# Patient Record
Sex: Male | Born: 1980 | Hispanic: Yes | Marital: Married | Smoking: Current every day smoker
Health system: Southern US, Community
[De-identification: ages and names within clinical notes are randomized; demographics above are authoritative.]

## PROBLEM LIST (undated history)

## (undated) DIAGNOSIS — F419 Anxiety disorder, unspecified: Secondary | ICD-10-CM

## (undated) DIAGNOSIS — M549 Dorsalgia, unspecified: Secondary | ICD-10-CM

## (undated) HISTORY — PX: KNEE SURGERY: SHX244

## (undated) HISTORY — PX: FACIAL RECONSTRUCTION SURGERY: SHX631

---

## 1998-05-05 ENCOUNTER — Emergency Department (HOSPITAL_COMMUNITY): Admission: EM | Admit: 1998-05-05 | Discharge: 1998-05-05 | Payer: Self-pay | Admitting: Endocrinology

## 1998-05-15 ENCOUNTER — Emergency Department (HOSPITAL_COMMUNITY): Admission: EM | Admit: 1998-05-15 | Discharge: 1998-05-15 | Payer: Self-pay | Admitting: Emergency Medicine

## 1998-07-15 ENCOUNTER — Emergency Department (HOSPITAL_COMMUNITY): Admission: EM | Admit: 1998-07-15 | Discharge: 1998-07-15 | Payer: Self-pay | Admitting: Emergency Medicine

## 2000-07-20 ENCOUNTER — Emergency Department (HOSPITAL_COMMUNITY): Admission: EM | Admit: 2000-07-20 | Discharge: 2000-07-20 | Payer: Self-pay | Admitting: Emergency Medicine

## 2001-03-26 ENCOUNTER — Emergency Department (HOSPITAL_COMMUNITY): Admission: EM | Admit: 2001-03-26 | Discharge: 2001-03-26 | Payer: Self-pay | Admitting: Emergency Medicine

## 2008-03-02 ENCOUNTER — Emergency Department (HOSPITAL_COMMUNITY): Admission: EM | Admit: 2008-03-02 | Discharge: 2008-03-02 | Payer: Self-pay | Admitting: Emergency Medicine

## 2008-04-28 ENCOUNTER — Encounter: Admission: RE | Admit: 2008-04-28 | Discharge: 2008-04-28 | Payer: Self-pay | Admitting: Nephrology

## 2008-07-12 ENCOUNTER — Emergency Department (HOSPITAL_COMMUNITY): Admission: EM | Admit: 2008-07-12 | Discharge: 2008-07-12 | Payer: Self-pay | Admitting: Emergency Medicine

## 2008-07-27 ENCOUNTER — Encounter: Admission: RE | Admit: 2008-07-27 | Discharge: 2008-07-27 | Payer: Self-pay | Admitting: Orthopedic Surgery

## 2008-08-04 ENCOUNTER — Ambulatory Visit (HOSPITAL_BASED_OUTPATIENT_CLINIC_OR_DEPARTMENT_OTHER): Admission: RE | Admit: 2008-08-04 | Discharge: 2008-08-04 | Payer: Self-pay | Admitting: Orthopedic Surgery

## 2009-02-08 ENCOUNTER — Emergency Department (HOSPITAL_COMMUNITY): Admission: EM | Admit: 2009-02-08 | Discharge: 2009-02-08 | Payer: Self-pay | Admitting: Emergency Medicine

## 2009-02-13 ENCOUNTER — Emergency Department (HOSPITAL_COMMUNITY): Admission: EM | Admit: 2009-02-13 | Discharge: 2009-02-13 | Payer: Self-pay | Admitting: Emergency Medicine

## 2009-02-28 ENCOUNTER — Ambulatory Visit (HOSPITAL_COMMUNITY): Admission: RE | Admit: 2009-02-28 | Discharge: 2009-02-28 | Payer: Self-pay | Admitting: Chiropractic Medicine

## 2009-04-04 ENCOUNTER — Emergency Department (HOSPITAL_COMMUNITY): Admission: EM | Admit: 2009-04-04 | Discharge: 2009-04-04 | Payer: Self-pay | Admitting: Emergency Medicine

## 2009-08-15 ENCOUNTER — Inpatient Hospital Stay (HOSPITAL_COMMUNITY): Admission: AC | Admit: 2009-08-15 | Discharge: 2009-08-16 | Payer: Self-pay

## 2010-01-21 ENCOUNTER — Emergency Department (HOSPITAL_COMMUNITY): Admission: EM | Admit: 2010-01-21 | Discharge: 2010-01-21 | Payer: Self-pay | Admitting: Emergency Medicine

## 2010-10-28 ENCOUNTER — Encounter: Payer: Self-pay | Admitting: Orthopedic Surgery

## 2010-10-28 ENCOUNTER — Encounter: Payer: Self-pay | Admitting: Nephrology

## 2010-11-29 ENCOUNTER — Ambulatory Visit
Admission: RE | Admit: 2010-11-29 | Discharge: 2010-11-29 | Disposition: A | Discharge status: Home / Self Care | Payer: Self-pay | Source: Ambulatory Visit | Attending: Nephrology | Admitting: Nephrology

## 2010-11-29 ENCOUNTER — Other Ambulatory Visit: Payer: Self-pay | Admitting: Nephrology

## 2010-11-29 DIAGNOSIS — I10 Essential (primary) hypertension: Secondary | ICD-10-CM

## 2011-01-09 LAB — CBC
HCT: 45.7 % (ref 39.0–52.0)
Hemoglobin: 13.1 g/dL (ref 13.0–17.0)
MCHC: 35 g/dL (ref 30.0–36.0)
MCV: 86.4 fL (ref 78.0–100.0)
MCV: 86.7 fL (ref 78.0–100.0)
Platelets: 237 10*3/uL (ref 150–400)
RBC: 4.34 MIL/uL (ref 4.22–5.81)
RBC: 4.56 MIL/uL (ref 4.22–5.81)
RBC: 5.29 MIL/uL (ref 4.22–5.81)
WBC: 13.2 10*3/uL — ABNORMAL HIGH (ref 4.0–10.5)
WBC: 21.6 10*3/uL — ABNORMAL HIGH (ref 4.0–10.5)
WBC: 22.8 10*3/uL — ABNORMAL HIGH (ref 4.0–10.5)

## 2011-01-09 LAB — GLUCOSE, CAPILLARY: Glucose-Capillary: 123 mg/dL — ABNORMAL HIGH (ref 70–99)

## 2011-01-09 LAB — PROTIME-INR: INR: 1.05 (ref 0.00–1.49)

## 2011-01-09 LAB — COMPREHENSIVE METABOLIC PANEL
AST: 149 U/L — ABNORMAL HIGH (ref 0–37)
Albumin: 3.9 g/dL (ref 3.5–5.2)
Alkaline Phosphatase: 53 U/L (ref 39–117)
BUN: 16 mg/dL (ref 6–23)
CO2: 23 mEq/L (ref 19–32)
Chloride: 104 mEq/L (ref 96–112)
Creatinine, Ser: 1.15 mg/dL (ref 0.4–1.5)
GFR calc non Af Amer: >60 mL/min (ref >60)
Potassium: 4.1 mEq/L (ref 3.5–5.1)
Total Bilirubin: 1.1 mg/dL (ref 0.3–1.2)

## 2011-01-09 LAB — BASIC METABOLIC PANEL
BUN: 12 mg/dL (ref 6–23)
CO2: 24 mEq/L (ref 19–32)
Calcium: 7.9 mg/dL — ABNORMAL LOW (ref 8.4–10.5)
Chloride: 109 mEq/L (ref 96–112)
Creatinine, Ser: 1.03 mg/dL (ref 0.4–1.5)
Creatinine, Ser: 1.09 mg/dL (ref 0.4–1.5)
GFR calc Af Amer: >60 mL/min (ref >60)
GFR calc Af Amer: >60 mL/min (ref >60)
GFR calc non Af Amer: >60 mL/min (ref >60)
Sodium: 139 mEq/L (ref 135–145)

## 2011-01-09 LAB — TYPE AND SCREEN

## 2011-01-09 LAB — POCT I-STAT, CHEM 8
BUN: 21 mg/dL (ref 6–23)
Calcium, Ion: 1.1 mmol/L — ABNORMAL LOW (ref 1.12–1.32)
HCT: 48 % (ref 39.0–52.0)
Hemoglobin: 16.3 g/dL (ref 13.0–17.0)
Sodium: 140 meq/L (ref 135–145)
TCO2: 24 mmol/L (ref 0–100)

## 2011-01-09 LAB — APTT: aPTT: 23 seconds — ABNORMAL LOW (ref 24–37)

## 2011-01-09 LAB — ABO/RH: ABO/RH(D): A POS

## 2011-01-14 LAB — POCT I-STAT, CHEM 8
BUN: 23 mg/dL (ref 6–23)
Creatinine, Ser: 0.9 mg/dL (ref 0.4–1.5)
Glucose, Bld: 106 mg/dL — ABNORMAL HIGH (ref 70–99)
Hemoglobin: 14.3 g/dL (ref 13.0–17.0)
Potassium: 3.5 mEq/L (ref 3.5–5.1)
Sodium: 140 mEq/L (ref 135–145)
TCO2: 23 mmol/L (ref 0–100)

## 2011-02-19 NOTE — Op Note (Signed)
Paul Woodward, Paul Woodward             ACCOUNT NO.:  [id number]   MEDICAL RECORD NO.:  [id number]          PATIENT TYPE:  AMB   LOCATION:  DSC                          FACILITY:  [facility]   PHYSICIAN:  Rodney A. Mortenson, M.D.DATE OF BIRTH:  15-Mar-1981   DATE OF PROCEDURE:  08/04/2008  DATE OF DISCHARGE:                               OPERATIVE REPORT   JUSTIFICATION:  A 30 year old male has pain about the tibial tubercle on  the right.  Activity increases his pain and discomfort.  He is noted to  have bony ossicle just posterior to the patella tendon where the patella  tendon attached to the tibial tubercle and this was locally tender.  He  is now admitted to have removal of this bony mass.   Complications are discussed preoperatively.  Questions are answered and  encouraged.  No guarantee could be given and he clearly understands, but  wished to proceed.   JUSTIFICATION ON PATIENT SURGERY:  Minimal morbidity.   PREOPERATIVE DIAGNOSIS:  Bony ossicle, patellar tendon, right knee.   POSTOPERATIVE DIAGNOSIS:  Bony ossicle, patellar tendon, right knee.   OPERATION:  Removal of bony ossicle, superior to right tibial tubercle  and attached to posterior surface of patellar tendon, right knee.   SURGEON:  Lenard Galloway. Chaney Malling, MD   ANESTHESIA:  General.   PROCEDURE:  The patient was placed on the operating room table in the  supine position.  A pneumatic tourniquet was applied to the right upper  thigh.  The right lower extremity was prepped with DuraPrep and draped  out in the usual manner.  Leg was wrapped with an Esmarch and tourniquet  was elevated.  An incision was made parallel to the medial border of the  patellar tendon.  Skin edges were retracted.  Bleeders were coagulated.  The blunt dissection was carried down along the margin of the patellar  tendon.  The space behind the patellar tendon was entered and there was  a very large bony ossicle which was free, but attached to the  posterior  surface of patellar tendon at this level.  This is bang up against the  anterior aspect of the tibia.  Self-retaining retractor was put in  place.  Under direct vision using blunt and sharp dissection, this large  bony ossicle was carefully teased out of the posterior surface of the  patellar tendon.  It did not invade significant substance of the tendon  itself.  The whole ostia was removed as a single large entity and  delivered to the back table.  A 2-0 Vicryl was used to close the  paratenon and soft tissue.  The skin was closed with stainless steel  staples.  Marcaine was placed in wound.  Sterile dressing was applied.  The patient returned to the recovery room in the excellent condition.  Technically, this went extremely well.   DRAINS:  None.   COMPLICATIONS:  None.   DISPOSITION:  1. Norco for pain.  2. Usual postop instructions.  3. To my office on Wednesday.      Rodney A. Chaney Malling, M.D.  Electronically Signed  RAM/MEDQ  D:  08/04/2008  T:  08/04/2008  Job:  [id number]

## 2011-04-08 ENCOUNTER — Emergency Department (HOSPITAL_COMMUNITY)
Admission: EM | Admit: 2011-04-08 | Discharge: 2011-04-08 | Disposition: A | Discharge status: Home / Self Care | Payer: PRIVATE HEALTH INSURANCE | Attending: Emergency Medicine | Admitting: Emergency Medicine

## 2011-04-08 ENCOUNTER — Emergency Department (HOSPITAL_COMMUNITY): Payer: PRIVATE HEALTH INSURANCE

## 2011-04-08 DIAGNOSIS — IMO0002 Reserved for concepts with insufficient information to code with codable children: Secondary | ICD-10-CM | POA: Insufficient documentation

## 2011-04-08 DIAGNOSIS — R071 Chest pain on breathing: Secondary | ICD-10-CM | POA: Insufficient documentation

## 2011-04-08 DIAGNOSIS — I10 Essential (primary) hypertension: Secondary | ICD-10-CM | POA: Insufficient documentation

## 2011-04-08 DIAGNOSIS — F411 Generalized anxiety disorder: Secondary | ICD-10-CM | POA: Insufficient documentation

## 2011-04-08 DIAGNOSIS — R079 Chest pain, unspecified: Secondary | ICD-10-CM | POA: Insufficient documentation

## 2011-04-18 ENCOUNTER — Inpatient Hospital Stay (INDEPENDENT_AMBULATORY_CARE_PROVIDER_SITE_OTHER)
Admission: RE | Admit: 2011-04-18 | Discharge: 2011-04-18 | Disposition: A | Discharge status: Home / Self Care | Payer: Self-pay | Source: Ambulatory Visit | Attending: Family Medicine | Admitting: Family Medicine

## 2011-04-18 DIAGNOSIS — I10 Essential (primary) hypertension: Secondary | ICD-10-CM

## 2011-04-18 DIAGNOSIS — L089 Local infection of the skin and subcutaneous tissue, unspecified: Secondary | ICD-10-CM

## 2011-05-06 ENCOUNTER — Emergency Department (HOSPITAL_COMMUNITY)
Admission: EM | Admit: 2011-05-06 | Discharge: 2011-05-09 | Disposition: A | Discharge status: Other Acute Inpatient Hospital | Payer: Self-pay | Attending: Emergency Medicine | Admitting: Emergency Medicine

## 2011-05-06 DIAGNOSIS — F319 Bipolar disorder, unspecified: Secondary | ICD-10-CM | POA: Insufficient documentation

## 2011-05-06 DIAGNOSIS — F29 Unspecified psychosis not due to a substance or known physiological condition: Secondary | ICD-10-CM | POA: Insufficient documentation

## 2011-05-06 DIAGNOSIS — I10 Essential (primary) hypertension: Secondary | ICD-10-CM | POA: Insufficient documentation

## 2011-05-06 LAB — RAPID URINE DRUG SCREEN, HOSP PERFORMED
Benzodiazepines: POSITIVE — AB
Cocaine: NOT DETECTED
Opiates: NOT DETECTED

## 2011-05-06 LAB — CBC
HCT: 42 % (ref 39.0–52.0)
Hemoglobin: 15.1 g/dL (ref 13.0–17.0)
MCH: 29.8 pg (ref 26.0–34.0)
MCHC: 36 g/dL (ref 30.0–36.0)
MCV: 83 fL (ref 78.0–100.0)
RDW: 13.2 % (ref 11.5–15.5)

## 2011-05-06 LAB — COMPREHENSIVE METABOLIC PANEL
Albumin: 4.2 g/dL (ref 3.5–5.2)
Alkaline Phosphatase: 77 U/L (ref 39–117)
BUN: 12 mg/dL (ref 6–23)
Calcium: 9.6 mg/dL (ref 8.4–10.5)
Creatinine, Ser: 0.86 mg/dL (ref 0.50–1.35)
GFR calc Af Amer: >60 mL/min (ref >60)
Glucose, Bld: 97 mg/dL (ref 70–99)
Total Protein: 6.9 g/dL (ref 6.0–8.3)

## 2011-05-06 LAB — DIFFERENTIAL
Eosinophils Relative: 1 % (ref 0–5)
Lymphocytes Relative: 22 % (ref 12–46)
Monocytes Absolute: 0.7 10*3/uL (ref 0.1–1.0)
Monocytes Relative: 7 % (ref 3–12)
Neutro Abs: 7.6 10*3/uL (ref 1.7–7.7)

## 2011-05-06 LAB — ETHANOL: Alcohol, Ethyl (B): <11 mg/dL (ref 0–11)

## 2011-05-09 ENCOUNTER — Inpatient Hospital Stay (HOSPITAL_COMMUNITY)
Admission: AD | Admit: 2011-05-09 | Discharge: 2011-05-12 | DRG: 885 | Disposition: A | Discharge status: Home / Self Care | Payer: PRIVATE HEALTH INSURANCE | Attending: Psychiatry | Admitting: Psychiatry

## 2011-05-09 DIAGNOSIS — Z8614 Personal history of Methicillin resistant Staphylococcus aureus infection: Secondary | ICD-10-CM

## 2011-05-09 DIAGNOSIS — Z91199 Patient's noncompliance with other medical treatment and regimen due to unspecified reason: Secondary | ICD-10-CM

## 2011-05-09 DIAGNOSIS — Z9119 Patient's noncompliance with other medical treatment and regimen: Secondary | ICD-10-CM

## 2011-05-09 DIAGNOSIS — F319 Bipolar disorder, unspecified: Secondary | ICD-10-CM

## 2011-05-09 DIAGNOSIS — I10 Essential (primary) hypertension: Secondary | ICD-10-CM

## 2011-05-09 DIAGNOSIS — F39 Unspecified mood [affective] disorder: Principal | ICD-10-CM

## 2011-05-09 DIAGNOSIS — F411 Generalized anxiety disorder: Secondary | ICD-10-CM

## 2011-05-09 DIAGNOSIS — M549 Dorsalgia, unspecified: Secondary | ICD-10-CM

## 2011-05-09 DIAGNOSIS — G8929 Other chronic pain: Secondary | ICD-10-CM

## 2011-05-09 DIAGNOSIS — F602 Antisocial personality disorder: Secondary | ICD-10-CM

## 2011-05-10 DIAGNOSIS — F39 Unspecified mood [affective] disorder: Secondary | ICD-10-CM
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
## 2011-07-02 ENCOUNTER — Emergency Department (HOSPITAL_COMMUNITY)
Admission: EM | Admit: 2011-07-02 | Discharge: 2011-07-02 | Disposition: A | Discharge status: Home / Self Care | Payer: Self-pay | Attending: Emergency Medicine | Admitting: Emergency Medicine

## 2011-07-02 DIAGNOSIS — G8929 Other chronic pain: Secondary | ICD-10-CM | POA: Insufficient documentation

## 2011-07-02 DIAGNOSIS — M549 Dorsalgia, unspecified: Secondary | ICD-10-CM | POA: Insufficient documentation

## 2011-07-02 DIAGNOSIS — Z202 Contact with and (suspected) exposure to infections with a predominantly sexual mode of transmission: Secondary | ICD-10-CM | POA: Insufficient documentation

## 2011-07-02 DIAGNOSIS — I10 Essential (primary) hypertension: Secondary | ICD-10-CM | POA: Insufficient documentation

## 2011-07-09 LAB — POCT HEMOGLOBIN-HEMACUE: Hemoglobin: 16.3
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
## 2011-11-09 ENCOUNTER — Encounter (HOSPITAL_COMMUNITY): Payer: Self-pay | Admitting: Emergency Medicine

## 2011-11-09 ENCOUNTER — Emergency Department (HOSPITAL_COMMUNITY)
Admission: EM | Admit: 2011-11-09 | Discharge: 2011-11-09 | Disposition: A | Discharge status: Home / Self Care | Payer: Self-pay | Attending: Emergency Medicine | Admitting: Emergency Medicine

## 2011-11-09 DIAGNOSIS — R197 Diarrhea, unspecified: Secondary | ICD-10-CM | POA: Insufficient documentation

## 2011-11-09 DIAGNOSIS — I10 Essential (primary) hypertension: Secondary | ICD-10-CM | POA: Insufficient documentation

## 2011-11-09 DIAGNOSIS — R1013 Epigastric pain: Secondary | ICD-10-CM | POA: Insufficient documentation

## 2011-11-09 DIAGNOSIS — R10816 Epigastric abdominal tenderness: Secondary | ICD-10-CM | POA: Insufficient documentation

## 2011-11-09 DIAGNOSIS — R112 Nausea with vomiting, unspecified: Secondary | ICD-10-CM | POA: Insufficient documentation

## 2011-11-09 HISTORY — DX: Dorsalgia, unspecified: M54.9

## 2011-11-09 LAB — COMPREHENSIVE METABOLIC PANEL
Albumin: 4.4 g/dL (ref 3.5–5.2)
BUN: 15 mg/dL (ref 6–23)
CO2: 29 mEq/L (ref 19–32)
Calcium: 10.1 mg/dL (ref 8.4–10.5)
Chloride: 102 mEq/L (ref 96–112)
Creatinine, Ser: 0.99 mg/dL (ref 0.50–1.35)
GFR calc non Af Amer: >90 mL/min (ref >90)
Total Bilirubin: 0.7 mg/dL (ref 0.3–1.2)

## 2011-11-09 LAB — DIFFERENTIAL
Basophils Relative: 0 % (ref 0–1)
Eosinophils Relative: 1 % (ref 0–5)
Monocytes Absolute: 0.8 10*3/uL (ref 0.1–1.0)
Monocytes Relative: 8 % (ref 3–12)
Neutro Abs: 7.4 10*3/uL (ref 1.7–7.7)

## 2011-11-09 LAB — CBC
HCT: 48.2 % (ref 39.0–52.0)
Hemoglobin: 17.3 g/dL — ABNORMAL HIGH (ref 13.0–17.0)
MCHC: 35.9 g/dL (ref 30.0–36.0)
MCV: 84.4 fL (ref 78.0–100.0)

## 2011-11-09 LAB — LIPASE, BLOOD: Lipase: 59 U/L (ref 11–59)

## 2011-11-09 MED ORDER — OMEPRAZOLE 20 MG PO CPDR: 20.0000 mg | DELAYED_RELEASE_CAPSULE | Freq: Every day | ORAL | Status: DC

## 2011-11-09 MED ORDER — PANTOPRAZOLE SODIUM 40 MG PO TBEC
40.0000 mg | DELAYED_RELEASE_TABLET | Freq: Once | ORAL | Status: AC
Start: 2011-11-09 — End: 2011-11-09
  Administered 2011-11-09: 40 mg via ORAL
  Filled 2011-11-09: qty 1

## 2011-11-09 MED ORDER — SODIUM CHLORIDE 0.9 % IV BOLUS (SEPSIS)
1000.0000 mL | Freq: Once | INTRAVENOUS | Status: AC
  Administered 2011-11-09: 1000 mL via INTRAVENOUS

## 2011-11-09 MED ORDER — ONDANSETRON 8 MG PO TBDP: 8.0000 mg | ORAL_TABLET | Freq: Three times a day (TID) | ORAL | Status: AC | PRN

## 2011-11-09 MED ORDER — GI COCKTAIL ~~LOC~~
30.0000 mL | Freq: Once | ORAL | Status: AC
  Administered 2011-11-09: 30 mL via ORAL
  Filled 2011-11-09: qty 30

## 2011-11-09 MED ORDER — ONDANSETRON HCL 4 MG/2ML IJ SOLN
4.0000 mg | Freq: Once | INTRAMUSCULAR | Status: AC
  Administered 2011-11-09: 4 mg via INTRAVENOUS
  Filled 2011-11-09: qty 2

## 2011-11-09 NOTE — ED Provider Notes (Signed)
History     CSN: [id number]  Arrival date & time 11/09/11  1842   First MD Initiated Contact with Patient 11/09/11 1932      Chief Complaint  Patient presents with  . Abdominal Pain    (Consider location/radiation/quality/duration/timing/severity/associated sxs/prior treatment) Patient is a 31 y.o. male presenting with abdominal pain. The history is provided by the patient.  Abdominal Pain The primary symptoms of the illness include abdominal pain, nausea, vomiting and diarrhea. The primary symptoms of the illness do not include fever, shortness of breath or dysuria. The current episode started more than 2 days ago. The onset of the illness was sudden. The problem has been gradually worsening.  Symptoms associated with the illness do not include chills or hematuria.  PT reports epigastric abdominal pain for about 3 days. Onset sudden, pain comes and goes. Independent of anything. Denies fever, chills. Admits to nausea and vomiting this morning. Denies any urinary symptoms. Took tums with no relief. No prior similar pain.   Past Medical History  Diagnosis Date  . Hypertension   . Back pain     Past Surgical History  Procedure Date  . Facial reconstruction surgery     No family history on file.  History  Substance Use Topics  . Smoking status: Current Everyday Smoker -- 1.0 packs/day  . Smokeless tobacco: Not on file  . Alcohol Use: No      Review of Systems  Constitutional: Negative for fever and chills.  HENT: Negative.   Eyes: Negative.   Respiratory: Negative.  Negative for shortness of breath.   Cardiovascular: Negative.   Gastrointestinal: Positive for nausea, vomiting, abdominal pain and diarrhea.  Genitourinary: Negative for dysuria, hematuria and flank pain.  Musculoskeletal: Negative.   Skin: Negative.   Neurological: Negative.   Psychiatric/Behavioral: Negative.     Allergies  Review of patient's allergies indicates no known allergies.  Home  Medications   Current Outpatient Rx  Name Route Sig Dispense Refill  . ALPRAZOLAM 1 MG PO TABS Oral Take 1 mg by mouth at bedtime as needed. As needed for anxiety    . HYDROCODONE-ACETAMINOPHEN 10-325 MG PO TABS Oral Take 1 tablet by mouth every 6 (six) hours as needed. For pain    . NAPROXEN SODIUM 220 MG PO TABS Oral Take 220 mg by mouth 2 (two) times daily with a meal.      BP 142/98  Pulse 90  Temp(Src) 99 F (37.2 C) (Oral)  Resp 20  SpO2 98%  Physical Exam  Nursing note and vitals reviewed. Constitutional: He is oriented to person, place, and time. He appears well-developed and well-nourished.  HENT:  Head: Atraumatic.  Eyes: Conjunctivae are normal.  Neck: Neck supple.  Cardiovascular: Normal rate, regular rhythm and normal heart sounds.   Pulmonary/Chest: Effort normal and breath sounds normal. No respiratory distress.  Abdominal: Soft. Bowel sounds are normal.       Epigastric tenderness. No guarding. No rebound tenderness.  Neurological: He is alert and oriented to person, place, and time.  Skin: Skin is warm and dry.  Psychiatric: He has a normal mood and affect.    ED Course  Procedures (including critical care time) 7:50 PM Pt seen and examined. Does not appear to be in distress. Abdomen soft. No guarding. Epigastric tenderness. Labs and medications ordered. Will monitor.   Results for orders placed during the hospital encounter of 11/09/11  CBC      Component Value Range   WBC 10.9 (*) 4.0 -  10.5 (K/uL)   RBC 5.71  4.22 - 5.81 (MIL/uL)   Hemoglobin 17.3 (*) 13.0 - 17.0 (g/dL)   HCT 16.1  09.6 - 04.5 (%)   MCV 84.4  78.0 - 100.0 (fL)   MCH 30.3  26.0 - 34.0 (pg)   MCHC 35.9  30.0 - 36.0 (g/dL)   RDW 40.9  81.1 - 91.4 (%)   Platelets 249  150 - 400 (K/uL)  DIFFERENTIAL      Component Value Range   Neutrophils Relative 68  43 - 77 (%)   Neutro Abs 7.4  1.7 - 7.7 (K/uL)   Lymphocytes Relative 24  12 - 46 (%)   Lymphs Abs 2.6  0.7 - 4.0 (K/uL)    Monocytes Relative 8  3 - 12 (%)   Monocytes Absolute 0.8  0.1 - 1.0 (K/uL)   Eosinophils Relative 1  0 - 5 (%)   Eosinophils Absolute 0.1  0.0 - 0.7 (K/uL)   Basophils Relative 0  0 - 1 (%)   Basophils Absolute 0.0  0.0 - 0.1 (K/uL)  COMPREHENSIVE METABOLIC PANEL      Component Value Range   Sodium 141  135 - 145 (mEq/L)   Potassium 3.4 (*) 3.5 - 5.1 (mEq/L)   Chloride 102  96 - 112 (mEq/L)   CO2 29  19 - 32 (mEq/L)   Glucose, Bld 90  70 - 99 (mg/dL)   BUN 15  6 - 23 (mg/dL)   Creatinine, Ser 7.82  0.50 - 1.35 (mg/dL)   Calcium 95.6  8.4 - 10.5 (mg/dL)   Total Protein 7.6  6.0 - 8.3 (g/dL)   Albumin 4.4  3.5 - 5.2 (g/dL)   AST 29  0 - 37 (U/L)   ALT 88 (*) 0 - 53 (U/L)   Alkaline Phosphatase 84  39 - 117 (U/L)   Total Bilirubin 0.7  0.3 - 1.2 (mg/dL)   GFR calc non Af Amer >90  >90 (mL/min)   GFR calc Af Amer >90  >90 (mL/min)  LIPASE, BLOOD      Component Value Range   Lipase 59  11 - 59 (U/L)   No results found.  Pt reassessed. Tolerating PO fluids. Abdomen soft. Discussed with Dr. Juleen China that examined pt as well. Agrees with plan to DC home, start on PPI. Suspect gastritis vs reflux vs peptic ulcer disease.    No diagnosis found.    MDM          Lottie Mussel, PA 11/10/11 [phone]

## 2011-11-09 NOTE — ED Notes (Signed)
Pt left before discharge papers could be given. Security was notified that pt may return

## 2011-11-09 NOTE — ED Notes (Signed)
Pt presenting to ed with c/o abdominal pain with nausea, vomiting and diarrhea. Pt states he thinks he's been running fevers.

## 2011-11-10 NOTE — ED Provider Notes (Signed)
Medical screening examination/treatment/procedure(s) were conducted as a shared visit with non-physician practitioner(s) and myself.  I personally evaluated the patient during the encounter.  30yM with epigastric pain. Abdominal exam benign on my examination.consider gastritis/pud/reflux, pancreatitis. W/u relatively unremarkable. Doubt renal/biliary colic. Discussed case with PA, Krichenko. PPI/H2 blocker at her discretion,.  Raeford Razor, MD 11/10/11 [phone]

## 2011-11-11 ENCOUNTER — Emergency Department (HOSPITAL_BASED_OUTPATIENT_CLINIC_OR_DEPARTMENT_OTHER)
Admission: EM | Admit: 2011-11-11 | Discharge: 2011-11-11 | Disposition: A | Discharge status: Home / Self Care | Payer: Self-pay | Attending: Emergency Medicine | Admitting: Emergency Medicine

## 2011-11-11 ENCOUNTER — Encounter (HOSPITAL_BASED_OUTPATIENT_CLINIC_OR_DEPARTMENT_OTHER): Payer: Self-pay | Admitting: *Deleted

## 2011-11-11 DIAGNOSIS — I10 Essential (primary) hypertension: Secondary | ICD-10-CM | POA: Insufficient documentation

## 2011-11-11 DIAGNOSIS — M549 Dorsalgia, unspecified: Secondary | ICD-10-CM | POA: Insufficient documentation

## 2011-11-11 DIAGNOSIS — F17200 Nicotine dependence, unspecified, uncomplicated: Secondary | ICD-10-CM | POA: Insufficient documentation

## 2011-11-11 DIAGNOSIS — K047 Periapical abscess without sinus: Secondary | ICD-10-CM | POA: Insufficient documentation

## 2011-11-11 DIAGNOSIS — K089 Disorder of teeth and supporting structures, unspecified: Secondary | ICD-10-CM | POA: Insufficient documentation

## 2011-11-11 MED ORDER — PENICILLIN V POTASSIUM 500 MG PO TABS: 500.0000 mg | ORAL_TABLET | Freq: Four times a day (QID) | ORAL | Status: AC

## 2011-11-11 NOTE — ED Provider Notes (Signed)
History     CSN: [id number]  Arrival date & time 11/11/11  1335   First MD Initiated Contact with Patient 11/11/11 1511      Chief Complaint  Patient presents with  . Dental Pain    (Consider location/radiation/quality/duration/timing/severity/associated sxs/prior treatment) Patient is a 31 y.o. male presenting with tooth pain. The history is provided by the patient.  Dental PainThe primary symptoms include mouth pain. Primary symptoms do not include fever or shortness of breath. The symptoms began 3 to 5 days ago. The symptoms are worsening. The symptoms are new. The symptoms occur constantly.  Mouth pain occurs constantly. Mouth pain is worsening. Affected locations include: gum(s).  Additional symptoms include: gum swelling, gum tenderness and jaw pain. Additional symptoms do not include: purulent gums, trismus, facial swelling, trouble swallowing, pain with swallowing, drooling, ear pain, hearing loss and nosebleeds. Medical issues include: smoking.    Past Medical History  Diagnosis Date  . Hypertension   . Back pain     Past Surgical History  Procedure Date  . Facial reconstruction surgery     No family history on file.  History  Substance Use Topics  . Smoking status: Current Everyday Smoker -- 1.0 packs/day  . Smokeless tobacco: Not on file  . Alcohol Use: No      Review of Systems  Constitutional: Negative for fever and chills.  HENT: Positive for dental problem. Negative for hearing loss, ear pain, nosebleeds, facial swelling, drooling, trouble swallowing, neck pain, neck stiffness and voice change.   Respiratory: Negative for choking and shortness of breath.   Skin: Negative for color change.    Allergies  Review of patient's allergies indicates no known allergies.  Home Medications   Current Outpatient Rx  Name Route Sig Dispense Refill  . ALPRAZOLAM 1 MG PO TABS Oral Take 1 mg by mouth at bedtime as needed. As needed for anxiety    .  HYDROCODONE-ACETAMINOPHEN 10-325 MG PO TABS Oral Take 1 tablet by mouth every 6 (six) hours as needed. For pain    . NAPROXEN SODIUM 220 MG PO TABS Oral Take 220 mg by mouth 2 (two) times daily with a meal.    . OMEPRAZOLE 20 MG PO CPDR Oral Take 1 capsule (20 mg total) by mouth daily. 30 capsule 0  . ONDANSETRON 8 MG PO TBDP Oral Take 1 tablet (8 mg total) by mouth every 8 (eight) hours as needed for nausea. 12 tablet 0    BP 130/90  Pulse 76  Temp(Src) 98.9 F (37.2 C) (Oral)  Resp 20  SpO2 99%  Physical Exam  Nursing note and vitals reviewed. Constitutional: He is oriented to person, place, and time. He appears well-developed and well-nourished. No distress.  HENT:  Head: Normocephalic and atraumatic. No trismus in the jaw.  Right Ear: External ear normal.  Left Ear: External ear normal.  Nose: Nose normal.  Mouth/Throat: No oropharyngeal exudate.    Neck: Normal range of motion. Neck supple.       No ludwig angina  Cardiovascular: Normal rate and regular rhythm.   Pulmonary/Chest: Effort normal. No respiratory distress.  Abdominal: Soft. He exhibits no distension. There is no tenderness.  Musculoskeletal: He exhibits no edema and no tenderness.  Lymphadenopathy:    He has no cervical adenopathy.  Neurological: He is alert and oriented to person, place, and time. No cranial nerve deficit.  Skin: Skin is warm and dry. No rash noted. No erythema.  Psychiatric: He has a normal mood  and affect.    ED Course  Procedures (including critical care time)  Labs Reviewed - No data to display No results found.   Dx 1: Dental Abscess   MDM  Likely dental abscess with no pocket visible to drain in ED. WIll rx abx and strongly advised dental/OMFS f/u. Pt receives narcotics chronically from PCP and no further pain medication is necessary today.        Shaaron Adler, [place] 11/11/11 1556
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
## 2011-11-11 NOTE — ED Provider Notes (Signed)
Medical screening examination/treatment/procedure(s) were performed by non-physician practitioner and as supervising physician I was immediately available for consultation/collaboration. Aijalon Kirtz Y.   Aki Burdin Y. Barry Faircloth, MD 11/11/11 2359 

## 2012-03-01 ENCOUNTER — Emergency Department (HOSPITAL_COMMUNITY)
Admission: EM | Admit: 2012-03-01 | Discharge: 2012-03-01 | Disposition: A | Discharge status: Home / Self Care | Payer: No Typology Code available for payment source | Attending: Emergency Medicine | Admitting: Emergency Medicine

## 2012-03-01 ENCOUNTER — Emergency Department (HOSPITAL_COMMUNITY): Payer: No Typology Code available for payment source

## 2012-03-01 ENCOUNTER — Encounter (HOSPITAL_COMMUNITY): Payer: Self-pay | Admitting: Emergency Medicine

## 2012-03-01 DIAGNOSIS — S5001XA Contusion of right elbow, initial encounter: Secondary | ICD-10-CM

## 2012-03-01 DIAGNOSIS — S20229A Contusion of unspecified back wall of thorax, initial encounter: Secondary | ICD-10-CM | POA: Insufficient documentation

## 2012-03-01 DIAGNOSIS — S5000XA Contusion of unspecified elbow, initial encounter: Secondary | ICD-10-CM | POA: Insufficient documentation

## 2012-03-01 DIAGNOSIS — F17200 Nicotine dependence, unspecified, uncomplicated: Secondary | ICD-10-CM | POA: Insufficient documentation

## 2012-03-01 DIAGNOSIS — Y9289 Other specified places as the place of occurrence of the external cause: Secondary | ICD-10-CM | POA: Insufficient documentation

## 2012-03-01 DIAGNOSIS — M545 Low back pain, unspecified: Secondary | ICD-10-CM | POA: Insufficient documentation

## 2012-03-01 DIAGNOSIS — M25529 Pain in unspecified elbow: Secondary | ICD-10-CM | POA: Insufficient documentation

## 2012-03-01 MED ORDER — HYDROMORPHONE HCL PF 1 MG/ML IJ SOLN
1.0000 mg | Freq: Once | INTRAMUSCULAR | Status: AC
  Administered 2012-03-01: 1 mg via INTRAMUSCULAR
  Filled 2012-03-01: qty 1

## 2012-03-01 MED ORDER — NAPROXEN 500 MG PO TABS: 500.0000 mg | ORAL_TABLET | Freq: Two times a day (BID) | ORAL | Status: DC

## 2012-03-01 MED ORDER — HYDROCODONE-ACETAMINOPHEN 5-500 MG PO TABS: 1.0000 | ORAL_TABLET | Freq: Four times a day (QID) | ORAL | Status: AC | PRN

## 2012-03-01 MED ORDER — TRAMADOL HCL 50 MG PO TABS: 50.0000 mg | ORAL_TABLET | Freq: Four times a day (QID) | ORAL | Status: DC | PRN
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
## 2012-03-01 NOTE — ED Notes (Signed)
Pt states understanding of discharge instructions 

## 2012-03-01 NOTE — Discharge Instructions (Signed)
xrays are normal - see your doctor as needed - naprosyn twice daily, ultram for severe pain
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
## 2012-03-09 ENCOUNTER — Other Ambulatory Visit: Payer: Self-pay | Admitting: Nephrology

## 2012-03-09 ENCOUNTER — Ambulatory Visit
Admission: RE | Admit: 2012-03-09 | Discharge: 2012-03-09 | Disposition: A | Discharge status: Home / Self Care | Payer: No Typology Code available for payment source | Source: Ambulatory Visit | Attending: Nephrology | Admitting: Nephrology

## 2012-03-09 DIAGNOSIS — R05 Cough: Secondary | ICD-10-CM

## 2012-05-11 ENCOUNTER — Emergency Department (HOSPITAL_COMMUNITY): Payer: Self-pay

## 2012-05-11 ENCOUNTER — Encounter (HOSPITAL_COMMUNITY): Payer: Self-pay | Admitting: Emergency Medicine

## 2012-05-11 ENCOUNTER — Emergency Department (HOSPITAL_COMMUNITY)
Admission: EM | Admit: 2012-05-11 | Discharge: 2012-05-11 | Disposition: A | Discharge status: Home / Self Care | Payer: Self-pay | Attending: Emergency Medicine | Admitting: Emergency Medicine

## 2012-05-11 DIAGNOSIS — R1084 Generalized abdominal pain: Secondary | ICD-10-CM | POA: Insufficient documentation

## 2012-05-11 DIAGNOSIS — R109 Unspecified abdominal pain: Secondary | ICD-10-CM | POA: Insufficient documentation

## 2012-05-11 HISTORY — DX: Anxiety disorder, unspecified: F41.9

## 2012-05-11 LAB — COMPREHENSIVE METABOLIC PANEL
ALT: 59 U/L — ABNORMAL HIGH (ref 0–53)
Alkaline Phosphatase: 62 U/L (ref 39–117)
BUN: 16 mg/dL (ref 6–23)
CO2: 25 mEq/L (ref 19–32)
Calcium: 9.6 mg/dL (ref 8.4–10.5)
GFR calc Af Amer: >90 mL/min (ref >90)
GFR calc non Af Amer: >90 mL/min (ref >90)
Glucose, Bld: 89 mg/dL (ref 70–99)
Potassium: 4 mEq/L (ref 3.5–5.1)
Sodium: 142 mEq/L (ref 135–145)

## 2012-05-11 LAB — CBC WITH DIFFERENTIAL/PLATELET
Basophils Absolute: 0 10*3/uL (ref 0.0–0.1)
Basophils Relative: 0 % (ref 0–1)
Lymphocytes Relative: 9 % — ABNORMAL LOW (ref 12–46)
MCHC: 34.8 g/dL (ref 30.0–36.0)
Neutro Abs: 10.1 10*3/uL — ABNORMAL HIGH (ref 1.7–7.7)
Neutrophils Relative %: 86 % — ABNORMAL HIGH (ref 43–77)
RDW: 12.8 % (ref 11.5–15.5)
WBC: 11.8 10*3/uL — ABNORMAL HIGH (ref 4.0–10.5)

## 2012-05-11 LAB — URINALYSIS, ROUTINE W REFLEX MICROSCOPIC
Bilirubin Urine: NEGATIVE
Glucose, UA: NEGATIVE mg/dL
Hgb urine dipstick: NEGATIVE
Protein, ur: NEGATIVE mg/dL
Urobilinogen, UA: 0.2 mg/dL (ref 0.0–1.0)

## 2012-05-11 LAB — LIPASE, BLOOD: Lipase: 46 U/L (ref 11–59)

## 2012-05-11 MED ORDER — HYDROCODONE-ACETAMINOPHEN 5-325 MG PO TABS: 1.0000 | ORAL_TABLET | Freq: Four times a day (QID) | ORAL | Status: AC | PRN

## 2012-05-11 MED ORDER — IOHEXOL 300 MG/ML  SOLN
100.0000 mL | Freq: Once | INTRAMUSCULAR | Status: AC | PRN
  Administered 2012-05-11: 100 mL via INTRAVENOUS

## 2012-05-11 MED ORDER — ONDANSETRON HCL 4 MG/2ML IJ SOLN
4.0000 mg | Freq: Once | INTRAMUSCULAR | Status: AC
Start: 2012-05-11 — End: 2012-05-11
  Administered 2012-05-11: 4 mg via INTRAVENOUS
  Filled 2012-05-11: qty 2

## 2012-05-11 MED ORDER — HYDROMORPHONE HCL PF 1 MG/ML IJ SOLN
1.0000 mg | Freq: Once | INTRAMUSCULAR | Status: AC
  Administered 2012-05-11: 1 mg via INTRAVENOUS
  Filled 2012-05-11: qty 1

## 2012-05-11 MED ORDER — SODIUM CHLORIDE 0.9 % IV BOLUS (SEPSIS)
250.0000 mL | Freq: Once | INTRAVENOUS | Status: AC
  Administered 2012-05-11: 250 mL via INTRAVENOUS

## 2012-05-11 MED ORDER — SODIUM CHLORIDE 0.9 % IV SOLN: INTRAVENOUS | Status: DC

## 2012-05-11 NOTE — ED Provider Notes (Signed)
History     CSN: [id number]  Arrival date & time 05/11/12  1005   First MD Initiated Contact with Patient 05/11/12 1011      Chief Complaint  Patient presents with  . Abdominal Pain    (Consider location/radiation/quality/duration/timing/severity/associated sxs/prior treatment) Patient is a 31 y.o. male presenting with abdominal pain. The history is provided by the patient.  Abdominal Pain The primary symptoms of the illness include abdominal pain. The primary symptoms of the illness do not include fever, shortness of breath, nausea, vomiting, diarrhea or dysuria. The current episode started 1 to 2 hours ago. The onset of the illness was sudden. The problem has been gradually improving.  Symptoms associated with the illness do not include back pain.   Patient with acute onset of lower corner abdominal pain this morning at about 845 it slowly starting to improve the pain initially was 10 out of 10 bouts about a 6/10 no radiation of the pain not associated with nausea vomiting or diarrhea no dysuria no prior similar pain. Pain was intermittent and nonradiating. Past Medical History  Diagnosis Date  . Back pain   . Anxiety   . Back pain     Past Surgical History  Procedure Date  . Facial reconstruction surgery   . Knee surgery     No family history on file.  History  Substance Use Topics  . Smoking status: Current Everyday Smoker -- 1.0 packs/day    Types: Cigarettes  . Smokeless tobacco: Not on file  . Alcohol Use: Yes     occasion      Review of Systems  Constitutional: Negative for fever.  HENT: Negative for neck pain.   Respiratory: Negative for shortness of breath.   Gastrointestinal: Positive for abdominal pain. Negative for nausea, vomiting and diarrhea.  Genitourinary: Negative for dysuria.  Musculoskeletal: Negative for back pain.  Skin: Negative for rash.  Neurological: Negative for headaches.  Hematological: Does not bruise/bleed easily.    Allergies    Review of patient's allergies indicates no known allergies.  Home Medications   Current Outpatient Rx  Name Route Sig Dispense Refill  . ALPRAZOLAM 1 MG PO TABS Oral Take 1 mg by mouth 3 (three) times daily as needed. As needed for anxiety    . HYDROCODONE-ACETAMINOPHEN 10-325 MG PO TABS Oral Take 1 tablet by mouth every 6 (six) hours as needed. For pain    . ALEVE PO Oral Take 1 tablet by mouth as needed. For headache    . HYDROCODONE-ACETAMINOPHEN 5-325 MG PO TABS Oral Take 1-2 tablets by mouth every 6 (six) hours as needed for pain. 10 tablet 0    BP 127/86  Pulse 82  Temp 97.8 F (36.6 C) (Oral)  Resp 16  Ht 5\' 7"  (1.702 m)  Wt 175 lb (79.379 kg)  BMI 27.41 kg/m2  SpO2 98%  Physical Exam  Nursing note and vitals reviewed. Constitutional: He appears well-developed and well-nourished.  HENT:  Head: Normocephalic and atraumatic.  Eyes: Conjunctivae and EOM are normal. Pupils are equal, round, and reactive to light.  Neck: Normal range of motion. Neck supple.  Cardiovascular: Normal rate, regular rhythm and normal heart sounds.   No murmur heard. Pulmonary/Chest: Effort normal and breath sounds normal.  Abdominal: Soft. Bowel sounds are normal. There is no tenderness.  Musculoskeletal: Normal range of motion.  Neurological: He is alert. No cranial nerve deficit. He exhibits normal muscle tone. Coordination normal.  Skin: Skin is warm. No rash noted.  ED Course  Procedures (including critical care time)  Labs Reviewed  COMPREHENSIVE METABOLIC PANEL - Abnormal; Notable for the following:    ALT 59 (*)     All other components within normal limits  CBC WITH DIFFERENTIAL - Abnormal; Notable for the following:    WBC 11.8 (*)     Neutrophils Relative 86 (*)     Neutro Abs 10.1 (*)     Lymphocytes Relative 9 (*)     All other components within normal limits  URINALYSIS, ROUTINE W REFLEX MICROSCOPIC  LIPASE, BLOOD   Ct Abdomen Pelvis W Contrast  05/11/2012   *RADIOLOGY REPORT*  Clinical Data: Abdominal pain.  CT ABDOMEN AND PELVIS WITH CONTRAST  Technique:  Multidetector CT imaging of the abdomen and pelvis was performed following the standard protocol during bolus administration of intravenous contrast.  Contrast: OMNIPAQUE IOHEXOL 300 MG/ML  SOLN  Comparison: None.  Findings: Mild dependent atelectasis at the lung bases.  Heart grossly normal.  Liver, gallbladder, pancreas, common bile duct, spleen, adrenal glands and kidneys appear within normal limits. Normal renal enhancement.  The stomach is normal.  Normal duodenum. Small bowel appears within normal limits.  Abdominal vasculature is normal.  Both ureters are normal.  Normal appendix interposed between loops of small bowel.  No aggressive osseous lesions.  Redundant sigmoid.  Distal colon decompressed. Calcified L5-S1 disc protrusion. L4-L5 disc protrusion is also present.  IMPRESSION: No acute abnormality.  Original Report Authenticated By: Andreas Newport, M.D.   Results for orders placed during the hospital encounter of 05/11/12  URINALYSIS, ROUTINE W REFLEX MICROSCOPIC      Component Value Range   Color, Urine YELLOW  YELLOW   APPearance CLEAR  CLEAR   Specific Gravity, Urine 1.025  1.005 - 1.030   pH 6.5  5.0 - 8.0   Glucose, UA NEGATIVE  NEGATIVE mg/dL   Hgb urine dipstick NEGATIVE  NEGATIVE   Bilirubin Urine NEGATIVE  NEGATIVE   Ketones, ur NEGATIVE  NEGATIVE mg/dL   Protein, ur NEGATIVE  NEGATIVE mg/dL   Urobilinogen, UA 0.2  0.0 - 1.0 mg/dL   Nitrite NEGATIVE  NEGATIVE   Leukocytes, UA NEGATIVE  NEGATIVE  COMPREHENSIVE METABOLIC PANEL      Component Value Range   Sodium 142  135 - 145 mEq/L   Potassium 4.0  3.5 - 5.1 mEq/L   Chloride 108  96 - 112 mEq/L   CO2 25  19 - 32 mEq/L   Glucose, Bld 89  70 - 99 mg/dL   BUN 16  6 - 23 mg/dL   Creatinine, Ser 9.60  0.50 - 1.35 mg/dL   Calcium 9.6  8.4 - 45.4 mg/dL   Total Protein 6.6  6.0 - 8.3 g/dL   Albumin 3.9  3.5 - 5.2 g/dL    AST 25  0 - 37 U/L   ALT 59 (*) 0 - 53 U/L   Alkaline Phosphatase 62  39 - 117 U/L   Total Bilirubin 0.3  0.3 - 1.2 mg/dL   GFR calc non Af Amer >90  >90 mL/min   GFR calc Af Amer >90  >90 mL/min  CBC WITH DIFFERENTIAL      Component Value Range   WBC 11.8 (*) 4.0 - 10.5 K/uL   RBC 5.36  4.22 - 5.81 MIL/uL   Hemoglobin 16.1  13.0 - 17.0 g/dL   HCT 09.8  11.9 - 14.7 %   MCV 86.2  78.0 - 100.0 fL   MCH  30.0  26.0 - 34.0 pg   MCHC 34.8  30.0 - 36.0 g/dL   RDW 29.5  62.1 - 30.8 %   Platelets 213  150 - 400 K/uL   Neutrophils Relative 86 (*) 43 - 77 %   Neutro Abs 10.1 (*) 1.7 - 7.7 K/uL   Lymphocytes Relative 9 (*) 12 - 46 %   Lymphs Abs 1.1  0.7 - 4.0 K/uL   Monocytes Relative 5  3 - 12 %   Monocytes Absolute 0.5  0.1 - 1.0 K/uL   Eosinophils Relative 1  0 - 5 %   Eosinophils Absolute 0.1  0.0 - 0.7 K/uL   Basophils Relative 0  0 - 1 %   Basophils Absolute 0.0  0.0 - 0.1 K/uL  LIPASE, BLOOD      Component Value Range   Lipase 46  11 - 59 U/L      1. Abdominal pain, generalized       MDM  Patient with acute onset of abdominal pain workup without specific findings CT scan negative no evidence of diverticulitis no evidence of acute appendicitis no evidence of gallbladder problems. Liver function test are normal lipase is not elevated. Patient improves some with pain medicine in the emergency department we'll send home with pain medication and followup with primary care Dr. at work note for 2 days. Patient denied any nausea vomiting or diarrhea associated with the acute onset of the pain which was in the lower quadrants of the abdomen.        Shelda Jakes, MD 05/11/12 [phone]

## 2012-05-11 NOTE — ED Notes (Signed)
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 Pt denies nausea, vomiting and diarrhea at this time. Pt denies pain at this time. Pt denies chest pain at this time.

## 2012-07-02 ENCOUNTER — Emergency Department (HOSPITAL_COMMUNITY)
Admission: EM | Admit: 2012-07-02 | Discharge: 2012-07-02 | Disposition: A | Discharge status: Home / Self Care | Payer: Self-pay | Attending: Emergency Medicine | Admitting: Emergency Medicine

## 2012-07-02 ENCOUNTER — Emergency Department (HOSPITAL_COMMUNITY): Payer: Self-pay

## 2012-07-02 DIAGNOSIS — H5710 Ocular pain, unspecified eye: Secondary | ICD-10-CM | POA: Insufficient documentation

## 2012-07-02 DIAGNOSIS — Z9889 Other specified postprocedural states: Secondary | ICD-10-CM | POA: Insufficient documentation

## 2012-07-02 DIAGNOSIS — F17200 Nicotine dependence, unspecified, uncomplicated: Secondary | ICD-10-CM | POA: Insufficient documentation

## 2012-07-02 DIAGNOSIS — F411 Generalized anxiety disorder: Secondary | ICD-10-CM | POA: Insufficient documentation

## 2012-07-02 DIAGNOSIS — L988 Other specified disorders of the skin and subcutaneous tissue: Secondary | ICD-10-CM

## 2012-07-02 MED ORDER — TRAMADOL HCL 50 MG PO TABS: 50.0000 mg | ORAL_TABLET | Freq: Four times a day (QID) | ORAL | Status: DC | PRN

## 2012-07-02 NOTE — ED Notes (Signed)
X-Ray tech notified to get disc of x-rays taken during this visit.
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
## 2012-07-02 NOTE — ED Provider Notes (Signed)
Medical screening examination/treatment/procedure(s) were performed by non-physician practitioner and as supervising physician I was immediately available for consultation/collaboration. Devoria Albe, MD, FACEP   Ward Givens, MD 07/02/12 [phone]
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
## 2012-12-26 ENCOUNTER — Encounter (HOSPITAL_COMMUNITY): Payer: Self-pay | Admitting: Emergency Medicine

## 2012-12-26 ENCOUNTER — Emergency Department (HOSPITAL_COMMUNITY)
Admission: EM | Admit: 2012-12-26 | Discharge: 2012-12-26 | Disposition: A | Discharge status: Home / Self Care | Payer: Self-pay | Attending: Emergency Medicine | Admitting: Emergency Medicine

## 2012-12-26 DIAGNOSIS — Z760 Encounter for issue of repeat prescription: Secondary | ICD-10-CM | POA: Insufficient documentation

## 2012-12-26 DIAGNOSIS — G8929 Other chronic pain: Secondary | ICD-10-CM | POA: Insufficient documentation

## 2012-12-26 DIAGNOSIS — F17200 Nicotine dependence, unspecified, uncomplicated: Secondary | ICD-10-CM | POA: Insufficient documentation

## 2012-12-26 DIAGNOSIS — Z8659 Personal history of other mental and behavioral disorders: Secondary | ICD-10-CM | POA: Insufficient documentation

## 2012-12-26 DIAGNOSIS — M549 Dorsalgia, unspecified: Secondary | ICD-10-CM | POA: Insufficient documentation

## 2012-12-26 DIAGNOSIS — Z87828 Personal history of other (healed) physical injury and trauma: Secondary | ICD-10-CM | POA: Insufficient documentation

## 2012-12-26 MED ORDER — KETOROLAC TROMETHAMINE 60 MG/2ML IM SOLN
60.0000 mg | Freq: Once | INTRAMUSCULAR | Status: AC
  Administered 2012-12-26: 60 mg via INTRAMUSCULAR
  Filled 2012-12-26: qty 2
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
## 2012-12-26 NOTE — ED Provider Notes (Signed)
  Medical screening examination/treatment/procedure(s) were performed by non-physician practitioner and as supervising physician I was immediately available for consultation/collaboration.    Nicolo Tomko, MD 12/26/12 1548 

## 2014-03-30 ENCOUNTER — Emergency Department (HOSPITAL_COMMUNITY)
Admission: EM | Admit: 2014-03-30 | Discharge: 2014-03-30 | Disposition: A | Discharge status: Home / Self Care | Payer: PRIVATE HEALTH INSURANCE | Attending: Emergency Medicine | Admitting: Emergency Medicine

## 2014-03-30 ENCOUNTER — Encounter (HOSPITAL_COMMUNITY): Payer: Self-pay | Admitting: Emergency Medicine

## 2014-03-30 ENCOUNTER — Emergency Department (HOSPITAL_COMMUNITY): Payer: PRIVATE HEALTH INSURANCE

## 2014-03-30 DIAGNOSIS — Y9389 Activity, other specified: Secondary | ICD-10-CM | POA: Insufficient documentation

## 2014-03-30 DIAGNOSIS — Z8659 Personal history of other mental and behavioral disorders: Secondary | ICD-10-CM | POA: Insufficient documentation

## 2014-03-30 DIAGNOSIS — Y9289 Other specified places as the place of occurrence of the external cause: Secondary | ICD-10-CM | POA: Insufficient documentation

## 2014-03-30 DIAGNOSIS — W11XXXA Fall on and from ladder, initial encounter: Secondary | ICD-10-CM

## 2014-03-30 DIAGNOSIS — S92211A Displaced fracture of cuboid bone of right foot, initial encounter for closed fracture: Secondary | ICD-10-CM

## 2014-03-30 DIAGNOSIS — S9000XA Contusion of unspecified ankle, initial encounter: Secondary | ICD-10-CM | POA: Insufficient documentation

## 2014-03-30 DIAGNOSIS — F17200 Nicotine dependence, unspecified, uncomplicated: Secondary | ICD-10-CM | POA: Insufficient documentation

## 2014-03-30 DIAGNOSIS — S92213A Displaced fracture of cuboid bone of unspecified foot, initial encounter for closed fracture: Secondary | ICD-10-CM | POA: Insufficient documentation

## 2014-03-30 MED ORDER — OXYCODONE-ACETAMINOPHEN 5-325 MG PO TABS
1.0000 | ORAL_TABLET | Freq: Once | ORAL | Status: AC
  Administered 2014-03-30: 1 via ORAL
  Filled 2014-03-30: qty 1

## 2014-03-30 MED ORDER — OXYCODONE-ACETAMINOPHEN 5-325 MG PO TABS: 1.0000 | ORAL_TABLET | ORAL | Status: DC | PRN

## 2014-03-30 NOTE — Progress Notes (Signed)
Orthopedic Tech Progress Note Patient Details:  Paul Woodward 6/Paul Woodward [id number]  Ortho Devices Type of Ortho Device: CAM walker Ortho Device/Splint Interventions: Application   [name], [name] 03/30/2014, 1:14 PM

## 2014-03-30 NOTE — Discharge Instructions (Signed)
Call orthopedic follow up today to schedule followup appointment within 1 week. Use crutches, do not bear weight on your ankle. Do not remove the splint. Use pain medication as directed, as needed for pain, or use Ibuprofen. Use ice and elevate your ankle.  Fracture A fracture is a break in a bone, due to a force on the bone that is greater than the bone's strength can handle. There are many types of fractures, including:  Complete fracture: The break passes completely through the bone.  Displaced: The ends of the bone fragments are not properly aligned.  Non-displaced: The ends of the bone fragments are in proper alignment.  Incomplete fracture (greenstick): The break does not pass completely through the bone. Incomplete fractures may or may not be angular (angulated).  Open fracture (compound): Part of the broken bone pokes through the skin. Open fractures have a high risk for infection.  Closed fracture: The fracture has not broken through the skin.  Comminuted fracture: The bone is broken into more than two pieces.  Compression fracture: The break occurs from extreme pressure on the bone (includes crushing injury).  Impacted fracture: The broken bone ends have been driven into each other.  Avulsion fracture: A ligament or tendon pulls a small piece of bone off from the main bony segment.  Pathologic fracture: A fracture due to the bone being made weak by a disease (osteoporosis or tumors).  Stress fracture: A fracture caused by intense exercise or repetitive and prolonged pressure that makes the bone weak. SYMPTOMS   Pain, tenderness, bleeding, bruising, and swelling at the fracture site.  Weakness and inability to bear weight on the injured extremity.  Paleness and deformity (sometimes).  Loss of pulse, numbness, tingling, or paralysis below the fracture site (usually a limb); these are emergencies. CAUSES  Bone being subjected to a force greater than its strength. RISK  INCREASES WITH:  Contact sports and falls from heights.  Previous or current bone problems (osteoporosis or tumors).  Poor balance.  Poor strength and flexibility. PREVENTION   Warm up and stretch properly before activity.  Maintain physical fitness:  Cardiovascular fitness.  Muscle strength.  Flexibility and endurance.  Wear proper protective equipment.  Use proper exercise technique. RELATED COMPLICATIONS   Bone fails to heal (nonunion).  Bone heals in a poor position (malunion).  Low blood volume (hypovolemic), shock due to blood loss.  Clump of fat cells travels through the blood (fat embolus) from the injury site to the lungs or brain (more common with thigh fractures).  Obstruction of nearby arteries. TREATMENT  Treatment first requires realigning of the bones (reduction) by a medically trained person, if the fracture is displaced. After realignment if the fracture is completed, or for non-displaced fractures, ice and medicine are used to reduce pain and inflammation. The bone and adjacent joints are then restrained with a splint, cast, or brace to allow the bones to heal without moving. Surgery is sometimes needed, to reposition the bones and hold the position with rods, pins, plates, or screws. Restraint for long periods of time may result in muscle and joint weakness or build up of fluid in tissues (edema). For this reason, physical therapy is often needed to regain strength and full range of motion. Recovery is complete when there is no bone motion at the fracture site and x-rays (radiographs) show complete healing.  MEDICATION   General anesthesia, sedation, or muscle relaxants may be needed to allow for realignment of the fracture. If pain medicine is  needed, nonsteroidal anti-inflammatory medicines (aspirin and ibuprofen), or other minor pain relievers (acetaminophen), are often advised.  Do not take pain medicine for 7 days before surgery.  Stronger pain  relievers may be prescribed by your caregiver. Use only as directed and only as much as you need. SEEK MEDICAL CARE IF:   The following occur after restraint or surgery. (Report any of these signs immediately):  Swelling above or below the fracture site.  Severe, persistent pain.  Blue or gray skin below the fracture site, especially under the nails. Numbness or loss of feeling below the fracture site. Document Released: 09/23/2005 Document Revised: 09/09/2012 Document Reviewed: 01/05/2009 [facility] Patient Information 2015 [place], [place]. This information is not intended to replace advice given to you by your health care provider. Make sure you discuss any questions you have with your health care provider.   Cast or Splint Care Casts and splints support injured limbs and keep bones from moving while they heal. It is important to care for your cast or splint at home.  HOME CARE INSTRUCTIONS  Keep the cast or splint uncovered during the drying period. It can take 24 to 48 hours to dry if it is made of plaster. A fiberglass cast will dry in less than 1 hour.  Do not rest the cast on anything harder than a pillow for the first 24 hours.  Do not put weight on your injured limb or apply pressure to the cast until your health care provider gives you permission.  Keep the cast or splint dry. Wet casts or splints can lose their shape and may not support the limb as well. A wet cast that has lost its shape can also create harmful pressure on your skin when it dries. Also, wet skin can become infected.  Cover the cast or splint with a plastic bag when bathing or when out in the rain or snow. If the cast is on the trunk of the body, take sponge baths until the cast is removed.  If your cast does become wet, dry it with a towel or a blow dryer on the cool setting only.  Keep your cast or splint clean. Soiled casts may be wiped with a moistened cloth.  Do not place any hard or soft foreign  objects under your cast or splint, such as cotton, toilet paper, lotion, or powder.  Do not try to scratch the skin under the cast with any object. The object could get stuck inside the cast. Also, scratching could lead to an infection. If itching is a problem, use a blow dryer on a cool setting to relieve discomfort.  Do not trim or cut your cast or remove padding from inside of it.  Exercise all joints next to the injury that are not immobilized by the cast or splint. For example, if you have a long leg cast, exercise the hip joint and toes. If you have an arm cast or splint, exercise the shoulder, elbow, thumb, and fingers.  Elevate your injured arm or leg on 1 or 2 pillows for the first 1 to 3 days to decrease swelling and pain.It is best if you can comfortably elevate your cast so it is higher than your heart. SEEK MEDICAL CARE IF:   Your cast or splint cracks.  Your cast or splint is too tight or too loose.  You have unbearable itching inside the cast.  Your cast becomes wet or develops a soft spot or area.  You have a bad smell  coming from inside your cast.  You get an object stuck under your cast.  Your skin around the cast becomes red or raw.  You have new pain or worsening pain after the cast has been applied. SEEK IMMEDIATE MEDICAL CARE IF:   You have fluid leaking through the cast.  You are unable to move your fingers or toes.  You have discolored (blue or white), cool, painful, or very swollen fingers or toes beyond the cast.  You have tingling or numbness around the injured area.  You have severe pain or pressure under the cast.  You have any difficulty with your breathing or have shortness of breath.  You have chest pain. Document Released: 09/20/2000 Document Revised: 07/14/2013 Document Reviewed: 04/01/2013 [facility]ExitCare Patient Information 2015 [place], [place]. This information is not intended to replace advice given to you by your health care provider. Make  sure you discuss any questions you have with your health care provider.

## 2014-03-30 NOTE — ED Provider Notes (Signed)
CSN: [id number]     Arrival date & time 03/30/14  1118 History   First MD Initiated Contact with Patient 03/30/14 1127     Chief Complaint  Patient presents with  . Fall  . Foot Injury     (Consider location/radiation/quality/duration/timing/severity/associated sxs/prior Treatment) HPI Comments: Paul Woodward is a 33 y.o. Male with a significant PMHx of back pain who presents today s/p fall from 2-3 foot ladder at work [employer]), states he lost his balance, associated with R ankle pain, 6/10 throbbing on lateral aspect, radiates to lower ankle and mid foot. States he fell down on his right foot. Doesn't know if he twisted his ankle, but he came down hard and immediately noticed the right ankle/foot began swelling. Used ice bucket to submerge his foot in, which helped with pain. Aggravated by movement, and wt bearing although he's able to walk through the pain. Denies hitting his head, LOC, CP, SOB, dizziness, vertigo, weakness, N/V, or paresthesias. Denies back injury or pain, no cauda equina symptoms. Denies visual changes or disturbance. Patient is a 33 y.o. male presenting with fall and ankle pain. The history is provided by the patient.  Fall This is a new problem. The current episode started today. Associated symptoms include arthralgias (right ankle) and joint swelling (right ankle). Pertinent negatives include no abdominal pain, chest pain, headaches, myalgias, nausea, neck pain, numbness, vertigo, visual change, vomiting or weakness.  Ankle Pain Location:  Ankle and foot Time since incident:  1 hour Injury: yes   Mechanism of injury: fall   Fall:    Fall occurred:  From a ladder   Height of fall:  2-3 ft   Impact surface:  Hard floor   Point of impact:  Feet   Entrapped after fall: no   Ankle location:  R ankle Foot location:  Dorsum of R foot Pain details:    Quality:  Throbbing   Radiates to: lower leg and into mid-foot.   Severity:  Moderate   Onset quality:  Sudden  Duration:  1 hour   Timing:  Constant   Progression:  Unchanged Chronicity:  New Dislocation: no   Foreign body present:  No foreign bodies Tetanus status:  Up to date Prior injury to area:  No Relieved by:  Ice and elevation Worsened by:  Bearing weight, flexion, extension, abduction, adduction and rotation Ineffective treatments:  None tried Associated symptoms: decreased ROM (secondary to pain) and swelling (right ankle)   Associated symptoms: no back pain, no muscle weakness, no neck pain, no numbness, no stiffness and no tingling   Risk factors: no concern for non-accidental trauma     Past Medical History  Diagnosis Date  . Back pain   . Anxiety   . Back pain    Past Surgical History  Procedure Laterality Date  . Facial reconstruction surgery    . Knee surgery     History reviewed. No pertinent family history. History  Substance Use Topics  . Smoking status: Current Every Day Smoker -- 1.00 packs/day    Types: Cigarettes  . Smokeless tobacco: Not on file  . Alcohol Use: Yes     Comment: occasion    Review of Systems  HENT: Negative for facial swelling.   Eyes: Negative for visual disturbance.  Respiratory: Negative for shortness of breath.   Cardiovascular: Negative for chest pain.  Gastrointestinal: Negative for nausea, vomiting and abdominal pain.  Musculoskeletal: Positive for arthralgias (right ankle) and joint swelling (right ankle). Negative for back  pain, myalgias, neck pain, neck stiffness and stiffness.  Skin: Negative for wound.  Neurological: Negative for dizziness, vertigo, syncope, weakness, numbness and headaches.  Psychiatric/Behavioral: Negative for confusion.      Allergies  Review of patient's allergies indicates no known allergies.  Home Medications   Prior to Admission medications   Medication Sig Start Date End Date Taking? Authorizing Provider  ibuprofen (ADVIL,MOTRIN) 200 MG tablet Take 200 mg by mouth every 6 (six) hours as  needed for pain.    Historical Provider, MD  oxyCODONE-acetaminophen (PERCOCET) 5-325 MG per tablet Take 1-2 tablets by mouth every 4 (four) hours as needed for moderate pain. 03/30/14   Mercedes Strupp Camprubi-Soms, PA-C   BP 159/104  Pulse 73  Temp(Src) 98.8 F (37.1 C) (Oral)  Resp 16  Ht 5\' 6"  (1.676 m)  Wt 178 lb (80.74 kg)  BMI 28.74 kg/m2  SpO2 98% Physical Exam  Nursing note and vitals reviewed. Constitutional: He is oriented to person, place, and time. Vital signs are normal. He appears well-developed and well-nourished. No distress.  HENT:  Head: Normocephalic and atraumatic.  Eyes: Conjunctivae and EOM are normal. Pupils are equal, round, and reactive to light.  Neck: Normal range of motion. No spinous process tenderness and no muscular tenderness present. No rigidity. No edema and normal range of motion present.  Cardiovascular: Normal rate.   Pulmonary/Chest: Effort normal.  Musculoskeletal:       Right ankle: He exhibits decreased range of motion (secondary to pain), swelling (lateral aspect just inferior to lateral malleolus) and ecchymosis. He exhibits no deformity, no laceration and normal pulse. Tenderness. AITFL and CF ligament tenderness found. No lateral malleolus, no medial malleolus, no head of 5th metatarsal and no proximal fibula tenderness found.  R ankle with decreased ROM secondary to swelling, but able to perform. Swelling to lateral aspect of R ankle inferior to lateral malleolus, with ecchymosis. No deformity or wound. TTP along AITFL and CFL. Non-TTP on medial aspect of joint. Strength 5/5, although pt reports pain with dorsiflexion/plantarflexion against resistance.   Neurological: He is alert and oriented to person, place, and time. He has normal strength. No sensory deficit.  Strength 5/5 bilaterally, although painful. Sensation grossly intact.   Skin: Skin is warm and dry. Bruising noted. No erythema.  Bruise to R ankle as noted above  Psychiatric: He  has a normal mood and affect.    ED Course  Procedures (including critical care time) Labs Review Labs Reviewed - No data to display  Imaging Review Dg Foot Complete Right  03/30/2014   CLINICAL DATA:  Traumatic injury and pain  EXAM: RIGHT FOOT COMPLETE - 3+ VIEW  COMPARISON:  01/21/2010  FINDINGS: Small bony density is noted adjacent to the cuboid bone laterally which may represent a small avulsion fracture. This is in an area of previously seen abnormality in could represent Re injuring of the same avulsion fracture. No other fractures are seen. No gross soft tissue abnormality is noted.  IMPRESSION: Findings suspicious for an avulsion fracture adjacent to the cuboid bone near its articulation with the calcaneus. Need for further evaluation can be determined on a clinical basis.   Electronically Signed   By: Alcide CleverMark  Lukens M.D.   On: 03/30/2014 12:19     EKG Interpretation None      MDM   Final diagnoses:  Cuboid fracture, right, closed, initial encounter  Accidental fall from ladder, initial encounter    Paul Woodward is a 33 y.o. male who  presents s/p fall from ladder, no LOC or head injury, fell onto right ankle. Exam remarkable for swelling but neurovascularly intact and no deformity. Xray revealing avulsion fx adjacent to cuboid. Splint with crutches was discussed with pt, who refused because he is going to the beach tomorrow. Was given option of cam walker and crutches which he accepted. Given percocet prior to d/c, and rx for more with instructions to f/up with ortho in 1 wk. Given instructions on RICE therapy and splint care. I explained the diagnosis and have given explicit precautions to return to the ER including for any other new or worsening symptoms. The patient understands and accepts the medical plan as it's been dictated and I have answered their questions. Discharge instructions concerning home care and prescriptions have been given. The patient is STABLE and is  discharged to home in good condition.  BP 159/104  Pulse 73  Temp(Src) 98.8 F (37.1 C) (Oral)  Resp 16  Ht 5\' 6"  (1.676 m)  Wt 178 lb (80.74 kg)  BMI 28.74 kg/m2  SpO2 98%     Celanese CorporationMercedes Strupp Camprubi-Soms, PA-C 03/30/14 1414

## 2014-03-30 NOTE — Progress Notes (Signed)
Orthopedic Tech Progress Note Patient Details:  Paul Woodward 1981-08-31 [id number]  Ortho Devices Type of Ortho Device: Crutches;Post (short leg) splint;Ace wrap Ortho Device/Splint Interventions: Application   [name], [name] 03/30/2014, 12:57 PM

## 2014-03-30 NOTE — ED Notes (Signed)
Pt reports falling approx 2-3 ft off a ladder, no loc. Having right foot pain and swelling, but ambulatory at triage.

## 2014-04-01 NOTE — ED Provider Notes (Signed)
Medical screening examination/treatment/procedure(s) were performed by non-physician practitioner and as supervising physician I was immediately available for consultation/collaboration.   EKG Interpretation None        Laray AngerKathleen M McManus, DO 04/01/14 [phone]

## 2014-05-29 ENCOUNTER — Emergency Department (HOSPITAL_COMMUNITY)
Admission: EM | Admit: 2014-05-29 | Discharge: 2014-05-30 | Disposition: A | Discharge status: Home / Self Care | Payer: PRIVATE HEALTH INSURANCE | Attending: Emergency Medicine | Admitting: Emergency Medicine

## 2014-05-29 ENCOUNTER — Encounter (HOSPITAL_COMMUNITY): Payer: Self-pay | Admitting: Emergency Medicine

## 2014-05-29 DIAGNOSIS — R51 Headache: Secondary | ICD-10-CM | POA: Insufficient documentation

## 2014-05-29 DIAGNOSIS — G43009 Migraine without aura, not intractable, without status migrainosus: Secondary | ICD-10-CM

## 2014-05-29 DIAGNOSIS — IMO0001 Reserved for inherently not codable concepts without codable children: Secondary | ICD-10-CM

## 2014-05-29 DIAGNOSIS — R030 Elevated blood-pressure reading, without diagnosis of hypertension: Secondary | ICD-10-CM

## 2014-05-29 DIAGNOSIS — Z8659 Personal history of other mental and behavioral disorders: Secondary | ICD-10-CM | POA: Insufficient documentation

## 2014-05-29 DIAGNOSIS — F17200 Nicotine dependence, unspecified, uncomplicated: Secondary | ICD-10-CM | POA: Insufficient documentation

## 2014-05-29 MED ORDER — DIPHENHYDRAMINE HCL 50 MG/ML IJ SOLN
50.0000 mg | Freq: Once | INTRAMUSCULAR | Status: AC
  Administered 2014-05-30: 50 mg via INTRAVENOUS
  Filled 2014-05-29: qty 1

## 2014-05-29 MED ORDER — METOCLOPRAMIDE HCL 5 MG/ML IJ SOLN
10.0000 mg | Freq: Once | INTRAMUSCULAR | Status: AC
  Administered 2014-05-30: 10 mg via INTRAVENOUS
  Filled 2014-05-29: qty 2

## 2014-05-29 MED ORDER — DEXAMETHASONE SODIUM PHOSPHATE 10 MG/ML IJ SOLN
10.0000 mg | Freq: Once | INTRAMUSCULAR | Status: AC
  Administered 2014-05-30: 10 mg via INTRAVENOUS
  Filled 2014-05-29: qty 1

## 2014-05-29 MED ORDER — SODIUM CHLORIDE 0.9 % IV BOLUS (SEPSIS)
2000.0000 mL | Freq: Once | INTRAVENOUS | Status: AC
  Administered 2014-05-30: 2000 mL via INTRAVENOUS

## 2014-05-29 MED ORDER — KETOROLAC TROMETHAMINE 30 MG/ML IJ SOLN
30.0000 mg | Freq: Once | INTRAMUSCULAR | Status: AC
  Administered 2014-05-30: 30 mg via INTRAVENOUS
  Filled 2014-05-29: qty 1
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
## 2014-05-29 NOTE — ED Notes (Signed)
Pt arrived to the ED with a headache.  Pt states the headache starts in the front and goes toward the middle of his head.  Pt states pain has been present since 1400 today.

## 2014-05-30 MED ORDER — METOCLOPRAMIDE HCL 10 MG PO TABS: 10.0000 mg | ORAL_TABLET | Freq: Four times a day (QID) | ORAL | Status: AC | PRN

## 2014-05-30 MED ORDER — LISINOPRIL 5 MG PO TABS: 5.0000 mg | ORAL_TABLET | Freq: Every day | ORAL | Status: AC

## 2014-05-30 MED ORDER — HYDROCODONE-ACETAMINOPHEN 5-325 MG PO TABS: 2.0000 | ORAL_TABLET | ORAL | Status: AC | PRN

## 2014-05-30 NOTE — ED Provider Notes (Signed)
Patient's headache is resolved. Repeat blood pressure is still elevated. Patient states he's had a history of high blood pressure in the past but is no longer any medication. He does not have a primary doctor. Will start low-dose antihypertensive and advised followup with a primary to manage blood pressure. Return precautions given  Loren Racer, MD 05/30/14 [phone]

## 2014-05-30 NOTE — ED Notes (Signed)
Patient left without signing.  

## 2014-05-30 NOTE — Discharge Instructions (Signed)

## 2016-03-24 ENCOUNTER — Emergency Department (HOSPITAL_COMMUNITY)
Admission: EM | Admit: 2016-03-24 | Discharge: 2016-03-24 | Disposition: A | Discharge status: Home / Self Care | Payer: No Typology Code available for payment source | Attending: Emergency Medicine | Admitting: Emergency Medicine

## 2016-03-24 ENCOUNTER — Encounter (HOSPITAL_COMMUNITY): Payer: Self-pay | Admitting: Emergency Medicine

## 2016-03-24 ENCOUNTER — Emergency Department (HOSPITAL_COMMUNITY): Payer: No Typology Code available for payment source

## 2016-03-24 DIAGNOSIS — R202 Paresthesia of skin: Secondary | ICD-10-CM | POA: Insufficient documentation

## 2016-03-24 DIAGNOSIS — M25511 Pain in right shoulder: Secondary | ICD-10-CM | POA: Diagnosis present

## 2016-03-24 DIAGNOSIS — R51 Headache: Secondary | ICD-10-CM | POA: Insufficient documentation

## 2016-03-24 DIAGNOSIS — R0781 Pleurodynia: Secondary | ICD-10-CM | POA: Diagnosis not present

## 2016-03-24 DIAGNOSIS — Y939 Activity, unspecified: Secondary | ICD-10-CM | POA: Diagnosis not present

## 2016-03-24 DIAGNOSIS — Y999 Unspecified external cause status: Secondary | ICD-10-CM | POA: Diagnosis not present

## 2016-03-24 DIAGNOSIS — F17210 Nicotine dependence, cigarettes, uncomplicated: Secondary | ICD-10-CM | POA: Diagnosis not present

## 2016-03-24 DIAGNOSIS — M542 Cervicalgia: Secondary | ICD-10-CM | POA: Diagnosis not present

## 2016-03-24 DIAGNOSIS — Y92410 Unspecified street and highway as the place of occurrence of the external cause: Secondary | ICD-10-CM | POA: Diagnosis not present

## 2016-03-24 DIAGNOSIS — M5416 Radiculopathy, lumbar region: Secondary | ICD-10-CM | POA: Diagnosis not present

## 2016-03-24 DIAGNOSIS — Z79899 Other long term (current) drug therapy: Secondary | ICD-10-CM | POA: Diagnosis not present

## 2016-03-24 MED ORDER — KETOROLAC TROMETHAMINE 60 MG/2ML IM SOLN
60.0000 mg | Freq: Once | INTRAMUSCULAR | Status: AC
  Administered 2016-03-24: 60 mg via INTRAMUSCULAR
  Filled 2016-03-24: qty 2

## 2016-03-24 MED ORDER — IBUPROFEN 800 MG PO TABS: 800.0000 mg | ORAL_TABLET | Freq: Three times a day (TID) | ORAL | Status: AC

## 2016-03-24 MED ORDER — METHOCARBAMOL 500 MG PO TABS: 500.0000 mg | ORAL_TABLET | Freq: Two times a day (BID) | ORAL | Status: AC
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
## 2021-05-20 ENCOUNTER — Encounter (HOSPITAL_COMMUNITY): Payer: Self-pay

## 2021-05-20 ENCOUNTER — Ambulatory Visit (HOSPITAL_COMMUNITY)
Admission: EM | Admit: 2021-05-20 | Discharge: 2021-05-20 | Disposition: A | Discharge status: Home / Self Care | Payer: Medicaid Other | Attending: Student | Admitting: Student

## 2021-05-20 ENCOUNTER — Ambulatory Visit (INDEPENDENT_AMBULATORY_CARE_PROVIDER_SITE_OTHER): Payer: Medicaid Other

## 2021-05-20 ENCOUNTER — Other Ambulatory Visit: Payer: Self-pay

## 2021-05-20 DIAGNOSIS — S63501A Unspecified sprain of right wrist, initial encounter: Secondary | ICD-10-CM

## 2021-05-20 DIAGNOSIS — M25531 Pain in right wrist: Secondary | ICD-10-CM | POA: Diagnosis not present

## 2021-05-20 NOTE — ED Provider Notes (Signed)
[facility]    CSN: [id number] Arrival date & time: 05/20/21  1707      History   Chief Complaint Chief Complaint  Patient presents with   Wrist Injury    HPI Paul Woodward is a 40 y.o. male presenting with R wrist injury x1 day. Medical history noncontributory.  Presenting with swelling and pain to the dorsal aspect of the right wrist for 1 day.  States he fell and tried to catch himself.  Oxycodone is providing relief. He is right handed.  Denies sensation changes. Denies falls, injuries elsewhere.  HPI  Past Medical History:  Diagnosis Date   Anxiety    Back pain    Back pain     There are no problems to display for this patient.   Past Surgical History:  Procedure Laterality Date   FACIAL RECONSTRUCTION SURGERY     KNEE SURGERY         Home Medications    Prior to Admission medications   Medication Sig Start Date End Date Taking? Authorizing Provider  amLODipine (NORVASC) 10 MG tablet Take 1 tablet by mouth daily. 05/04/20  Yes [provider]  levothyroxine (SYNTHROID) 50 MCG tablet Take 1 tablet by mouth daily. 05/23/20  Yes [provider]  oxyCODONE (OXY IR/ROXICODONE) 5 MG immediate release tablet Take one to two tablets every six to eight hours as needed for pain. 08/11/12  Yes [provider]  acetaminophen (TYLENOL) 325 MG tablet Take 650 mg by mouth every 6 (six) hours as needed for headache.    [provider]  ALPRAZolam Prudy Feeler) 1 MG tablet Take 1 mg by mouth 2 (two) times daily. 05/01/21   [provider]  atorvastatin (LIPITOR) 40 MG tablet Take 40 mg by mouth daily. 05/01/21   [provider]  HYDROcodone-acetaminophen (NORCO) 5-325 MG per tablet Take 2 tablets by mouth every 4 (four) hours as needed for severe pain. 05/30/14   Wayland Salinas, MD  ibuprofen (ADVIL,MOTRIN) 800 MG tablet Take 1 tablet (800 mg total) by mouth 3 (three) times daily. 03/24/16   Barrett, Rolm Gala, PA-C  lisinopril  (PRINIVIL,ZESTRIL) 5 MG tablet Take 1 tablet (5 mg total) by mouth daily. 05/30/14   Loren Racer, MD  methocarbamol (ROBAXIN) 500 MG tablet Take 1 tablet (500 mg total) by mouth 2 (two) times daily. 03/24/16   Barrett, Rolm Gala, PA-C  metoCLOPramide (REGLAN) 10 MG tablet Take 1 tablet (10 mg total) by mouth every 6 (six) hours as needed for nausea (nausea/headache). 05/30/14   Wayland Salinas, MD    Family History Family History  Problem Relation Age of Onset   Cancer Father     Social History Social History   Tobacco Use   Smoking status: Every Day    Packs/day: 1.00    Types: Cigarettes   Smokeless tobacco: Never  Substance Use Topics   Alcohol use: Yes    Comment: occasion   Drug use: No     Allergies   Other   Review of Systems Review of Systems  Musculoskeletal:        R wrist  pain  All other systems reviewed and are negative.   Physical Exam Triage Vital Signs ED Triage Vitals  Enc Vitals Group     BP      Pulse      Resp      Temp      Temp src      SpO2      Weight  Height      Head Circumference      Peak Flow      Pain Score      Pain Loc      Pain Edu?      Excl. in GC?    No data found.  Updated Vital Signs Pulse 67   Temp 98.2 F (36.8 C) (Oral)   Resp 19   SpO2 97%   Visual Acuity Right Eye Distance:   Left Eye Distance:   Bilateral Distance:    Right Eye Near:   Left Eye Near:    Bilateral Near:     Physical Exam Vitals reviewed.  Constitutional:      Appearance: Normal appearance.  HENT:     Head: Normocephalic and atraumatic.  Pulmonary:     Effort: Pulmonary effort is normal.  Musculoskeletal:     Comments: R wrist - TTP distal radius, dorsal aspect. Extension limited due to pain. Mild effusion. Sensation intact, cap refill <2 seconds, radial pulse 2+, grip strength 5/5, no snuffbox tenderness.   Skin:    Capillary Refill: Capillary refill takes less than 2 seconds.  Neurological:     General: No focal deficit  present.     Mental Status: He is alert and oriented to person, place, and time.  Psychiatric:        Mood and Affect: Mood normal.        Behavior: Behavior normal.        Thought Content: Thought content normal.        Judgment: Judgment normal.     UC Treatments / Results  Labs (all labs ordered are listed, but only abnormal results are displayed) Labs Reviewed - No data to display  EKG   Radiology DG Hand Complete Right  Result Date: 05/20/2021 CLINICAL DATA:  Wrist pain after bucket falling on hand. Patient presents with swelling and pain in the right wrist for 1 day. EXAM: RIGHT HAND - COMPLETE 3+ VIEW COMPARISON:  None. FINDINGS: Curvature of the distal fourth and fifth metacarpals is likely from previous fracture. No acute fracture line identified. No other abnormalities are identified. IMPRESSION: Curvature of the distal fourth and fifth metacarpals without visualization of acute fracture line is favored to be from prior healed fractures. Recommend clinical correlation in these regions to exclude point tenderness. Electronically Signed   By: Gerome Sam III M.D.   On: 05/20/2021 17:39    Procedures Procedures (including critical care time)  Medications Ordered in UC Medications - No data to display  Initial Impression / Assessment and Plan / UC Course  I have reviewed the triage vital signs and the nursing notes.  Pertinent labs & imaging results that were available during my care of the patient were reviewed by me and considered in my medical decision making (see chart for details).     This patient is a very pleasant 40 y.o. year old male presenting with R wrist sprain. Neurovascularly intact.   Xray R wrist - no abnormality of wrist.  Wrist brace, RICE, f/u with ortho if symptoms persist.  ED return precautions discussed. Patient verbalizes understanding and agreement.    Final Clinical Impressions(s) / UC Diagnoses   Final diagnoses:  Sprain of right  wrist, initial encounter     Discharge Instructions      -Wrist brace while pain persists -Rest, ice -If symptoms persist in 3 days, follow-up with an orthopedist. I recommend [facility] at [address], [place], [place] [address]. You can  schedule an appointment by calling [phone]) or online ([email or web address]), but they also have a walk-in clinic M-F 8a-8p and Sat 10a-3p.      ED Prescriptions   None    PDMP not reviewed this encounter.   Rhys Martini, PA-C 05/20/21 1746

## 2021-05-20 NOTE — ED Triage Notes (Signed)
Pt presets with swelling and pain in the right wrist x 1 day. Reports he fell and try to catch himself. Pt took Oxycodone 30 min ago.

## 2021-05-20 NOTE — Discharge Instructions (Addendum)
-  Wrist brace while pain persists -Rest, ice -If symptoms persist in 3 days, follow-up with an orthopedist. I recommend [facility] at [address] [place], [place] [address]. You can schedule an appointment by calling [phone]) or online ([email or web address]), but they also have a walk-in clinic M-F 8a-8p and Sat 10a-3p.

## 2023-02-26 ENCOUNTER — Other Ambulatory Visit: Payer: Self-pay | Admitting: Internal Medicine

## 2023-02-26 ENCOUNTER — Ambulatory Visit
Admission: RE | Admit: 2023-02-26 | Discharge: 2023-02-26 | Disposition: A | Discharge status: Home / Self Care | Payer: Medicaid Other | Source: Ambulatory Visit | Attending: Internal Medicine | Admitting: Internal Medicine

## 2023-02-26 DIAGNOSIS — R058 Other specified cough: Secondary | ICD-10-CM
